# Patient Record
Sex: Male | Born: 1945 | Race: Black or African American | Hispanic: No
Health system: Southern US, Community
[De-identification: ages and names within clinical notes are randomized; demographics above are authoritative.]

---

## 2007-05-09 ENCOUNTER — Other Ambulatory Visit: Payer: Self-pay

## 2007-05-09 ENCOUNTER — Emergency Department: Payer: Self-pay | Admitting: Emergency Medicine

## 2007-12-29 ENCOUNTER — Other Ambulatory Visit: Payer: Self-pay

## 2007-12-30 ENCOUNTER — Inpatient Hospital Stay: Payer: Self-pay | Admitting: Internal Medicine

## 2007-12-30 ENCOUNTER — Other Ambulatory Visit: Payer: Self-pay

## 2007-12-31 ENCOUNTER — Other Ambulatory Visit: Payer: Self-pay

## 2009-06-10 ENCOUNTER — Inpatient Hospital Stay: Payer: Self-pay | Admitting: Internal Medicine

## 2009-07-17 ENCOUNTER — Emergency Department: Payer: Self-pay | Admitting: Emergency Medicine

## 2009-07-19 ENCOUNTER — Ambulatory Visit: Payer: Self-pay | Admitting: Otolaryngology

## 2009-08-02 ENCOUNTER — Ambulatory Visit: Payer: Self-pay | Admitting: Otolaryngology

## 2010-04-09 ENCOUNTER — Inpatient Hospital Stay: Payer: Self-pay | Admitting: Internal Medicine

## 2010-06-10 ENCOUNTER — Emergency Department: Payer: Self-pay | Admitting: Internal Medicine

## 2010-09-05 ENCOUNTER — Inpatient Hospital Stay: Payer: Self-pay | Admitting: Internal Medicine

## 2010-11-21 ENCOUNTER — Emergency Department: Payer: Self-pay | Admitting: Internal Medicine

## 2011-04-02 ENCOUNTER — Emergency Department: Payer: Self-pay | Admitting: Emergency Medicine

## 2012-02-05 IMAGING — CT CT HEAD WITHOUT CONTRAST
2 series · 15 of 30 positions shown, 19 images · non-contrast
Comparison: none

REASON FOR EXAM: CVA
COMMENTS:   May transport without cardiac monitor

PROCEDURE:     CT  - CT HEAD WITHOUT CONTRAST  - April 02, 2011  [DATE]
RESULT:     Comparison:  09/06/2010
TECHNIQUE: Multiple axial images from the foramen magnum to the vertex were
obtained without IV contrast.

[Series 2: without · axial · non-contrast · 0.41mm/px · z∈[+1234,+1354]mm · 13 of 28 slices shown, 17 images]
[im 2/28  brain]
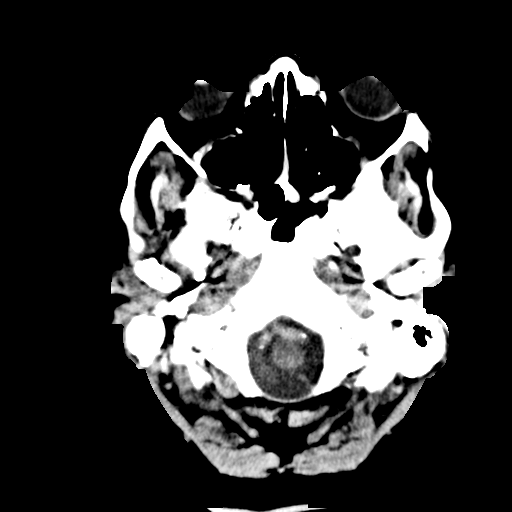
[im 2/28  bone]
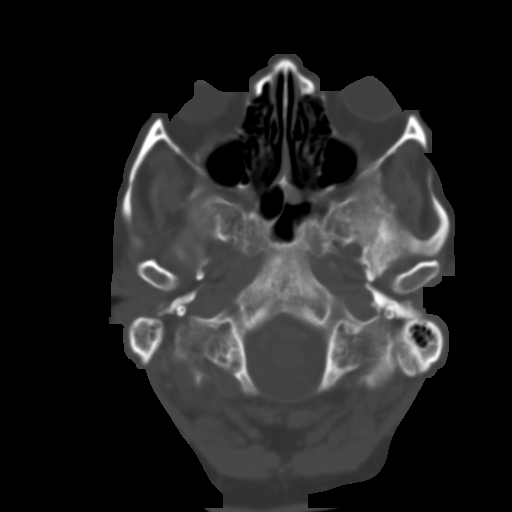
[im 4/28  brain]
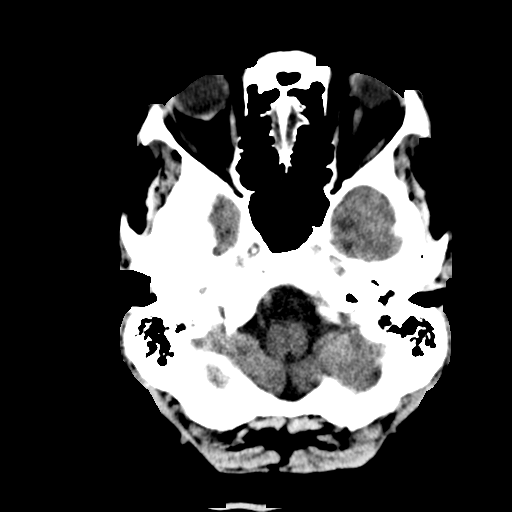
[im 6/28  brain]
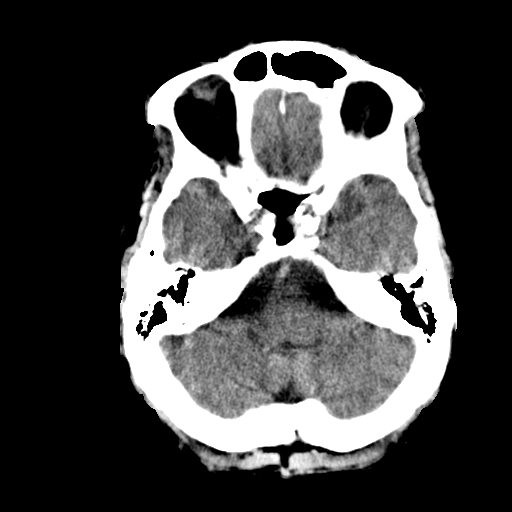
[im 8/28  brain]
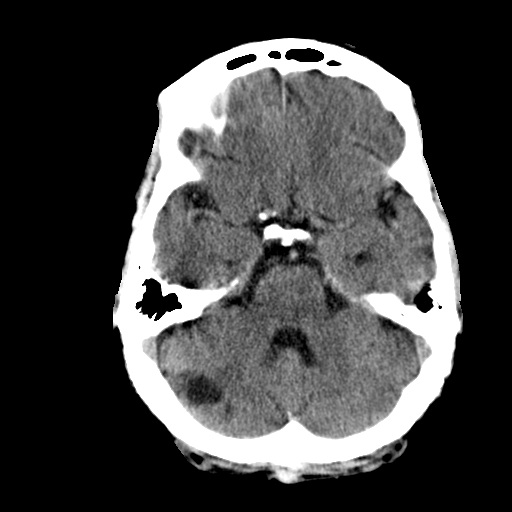
[im 10/28  brain]
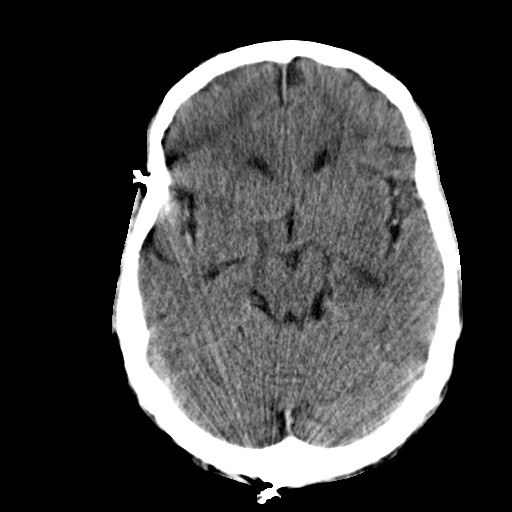
[im 10/28  bone]
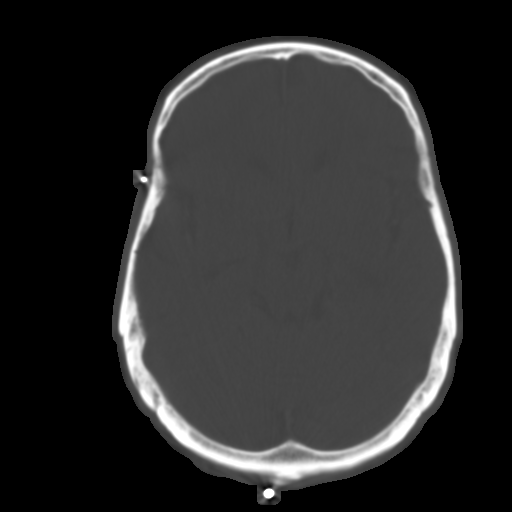
[im 12/28  brain]
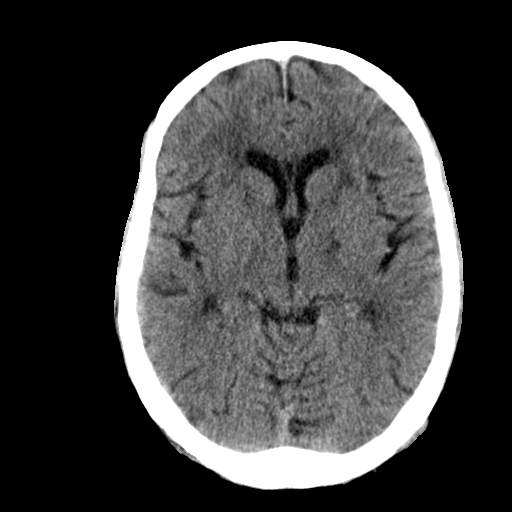
[im 14/28  brain]
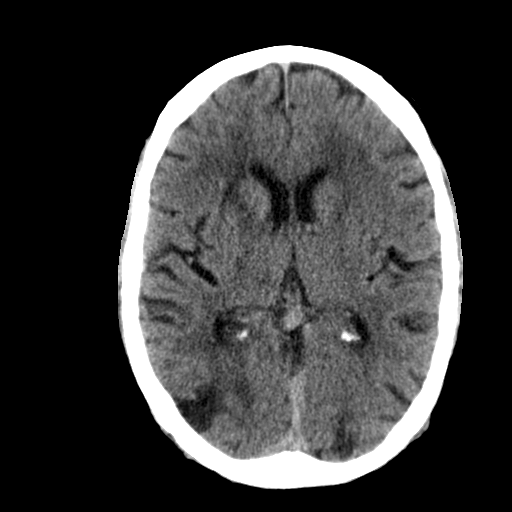
[im 16/28  brain]
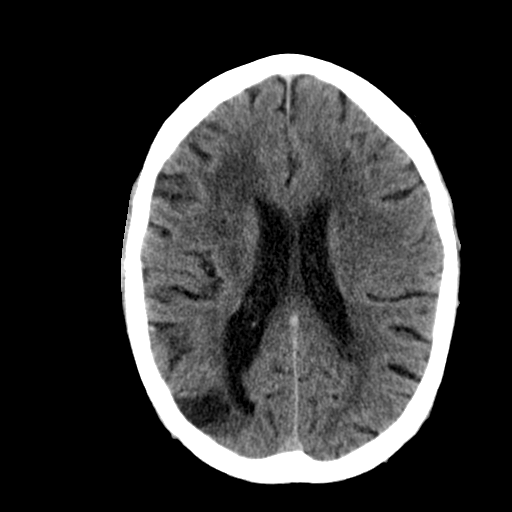
[im 18/28  brain]
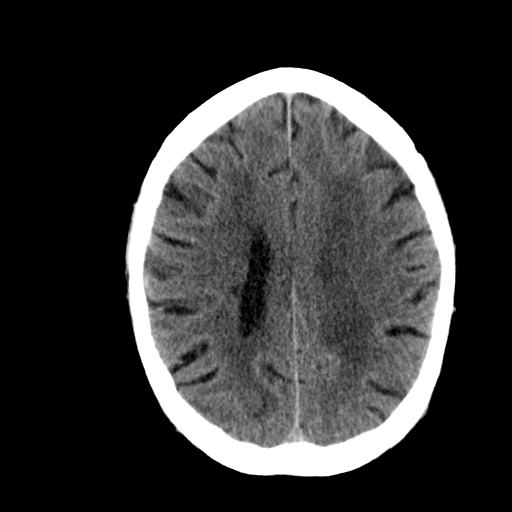
[im 18/28  bone]
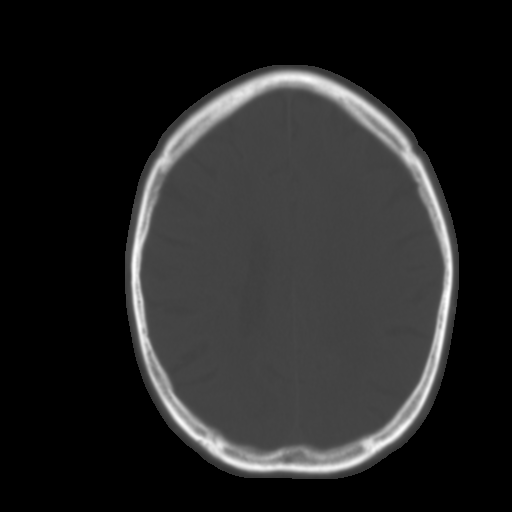
[im 20/28  brain]
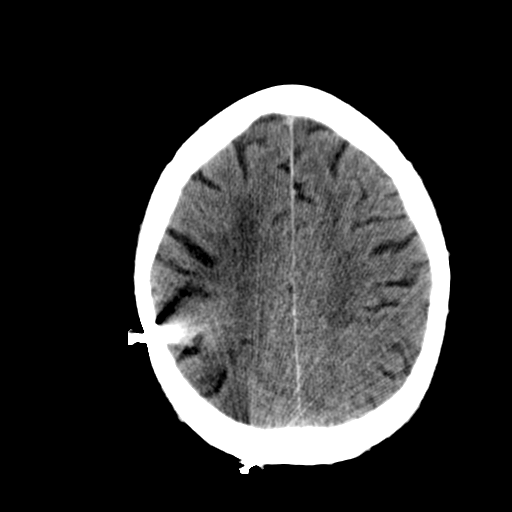
[im 22/28  brain]
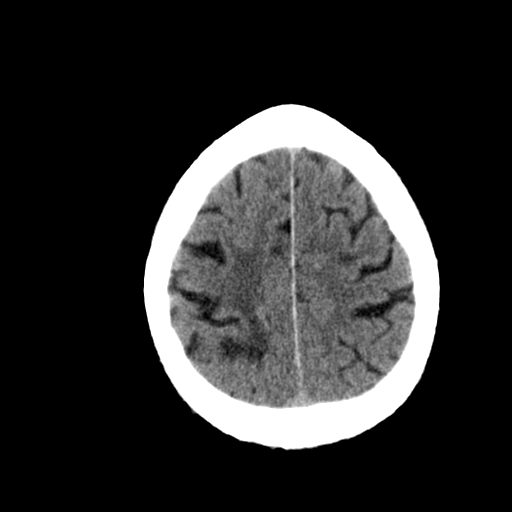
[im 24/28  brain]
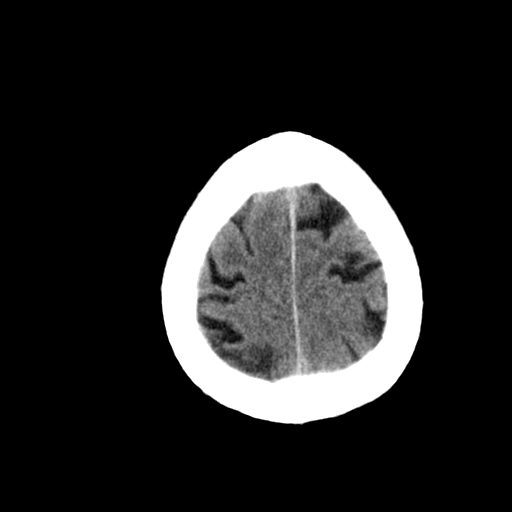
[im 26/28  brain]
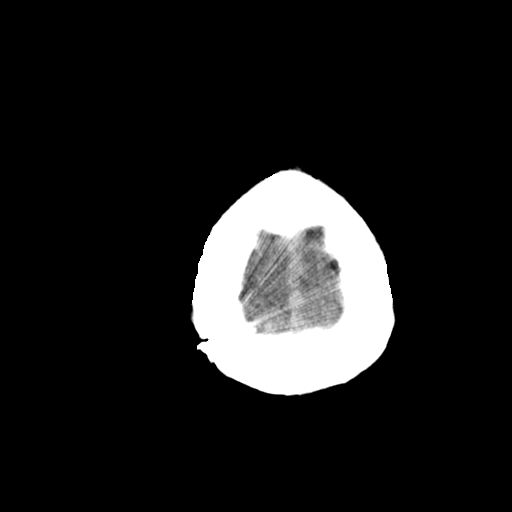
[im 26/28  bone]
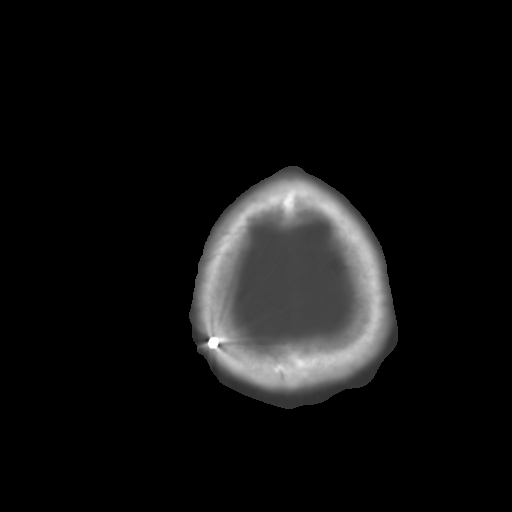

[Series 3: bone · axial · 0.41mm/px · z∈[+1234,+1254]mm · 2 of 28 slices shown]
[im 2/28  bone]
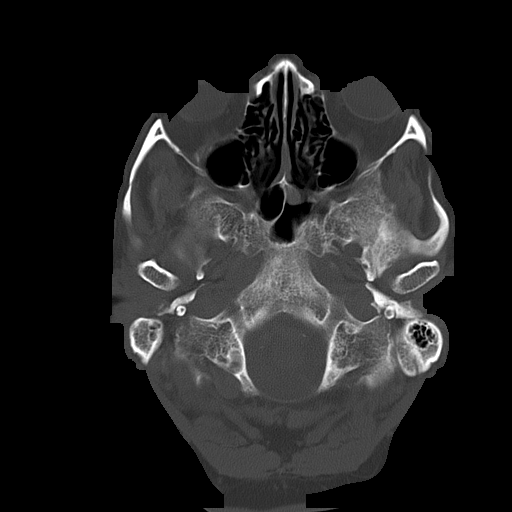
[im 6/28  bone]
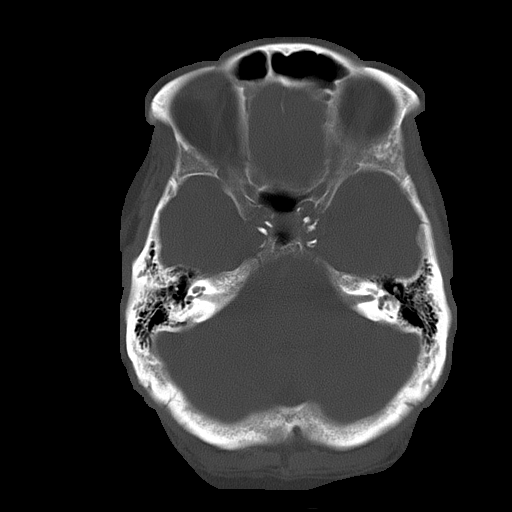

[15 of 30 positions shown; findings below may reference images not displayed]

FINDINGS: There is no evidence for mass effect, midline shift, or extra-axial fluid
collections. There is no evidence for space-occupying lesion, intracranial
hemorrhage, or acute cortical-based area of infarction. Periventricular
hypoattenuation is consistent with chronic small vessel ischemic disease.
Encephalomalacia is seen from old right parietal lobe infarcts and old right
cerebellar infarct. Streak artifact from round metallic densities in the
scalp slight limits evaluation. Old lacunar infarct demonstrated in the left
internal capsule.

The osseous structures are unremarkable.
IMPRESSION: 1. No acute intracranial process.
2. Chronic small vessel ischemic disease. Multiple old right-sided infarcts

CT can underestimate ischemia in the first 24 hours after the event. If
there is clinical concern for an acute infarct, a followup MRI or repeat CT
scan in 24 hours may provide additional information..

## 2013-01-02 ENCOUNTER — Inpatient Hospital Stay: Payer: Self-pay | Admitting: Specialist

## 2013-01-02 LAB — COMPREHENSIVE METABOLIC PANEL
Anion Gap: 7 (ref 7–16)
BUN: 21 mg/dL — ABNORMAL HIGH (ref 7–18)
Co2: 26 mmol/L (ref 21–32)
EGFR (African American): >60
EGFR (Non-African Amer.): 57 — ABNORMAL LOW
Glucose: 129 mg/dL — ABNORMAL HIGH (ref 65–99)
Potassium: 4.6 mmol/L (ref 3.5–5.1)
SGOT(AST): 43 U/L — ABNORMAL HIGH (ref 15–37)
SGPT (ALT): 53 U/L (ref 12–78)
Total Protein: 7.7 g/dL (ref 6.4–8.2)

## 2013-01-02 LAB — CBC
HGB: 14.8 g/dL (ref 13.0–18.0)
MCH: 30 pg (ref 26.0–34.0)
MCHC: 32.8 g/dL (ref 32.0–36.0)
MCV: 92 fL (ref 80–100)
RBC: 4.94 10*6/uL (ref 4.40–5.90)
WBC: 8.9 10*3/uL (ref 3.8–10.6)

## 2013-01-02 LAB — TROPONIN I: Troponin-I: 0.04 ng/mL

## 2013-01-02 LAB — PRO B NATRIURETIC PEPTIDE: B-Type Natriuretic Peptide: 5967 pg/mL — ABNORMAL HIGH (ref 0–125)

## 2013-01-03 LAB — CBC WITH DIFFERENTIAL/PLATELET
Basophil #: 0 10*3/uL (ref 0.0–0.1)
Basophil %: 0.1 %
Eosinophil #: 0 10*3/uL (ref 0.0–0.7)
Eosinophil %: 0 %
HGB: 14.4 g/dL (ref 13.0–18.0)
Lymphocyte #: 0.8 10*3/uL — ABNORMAL LOW (ref 1.0–3.6)
MCH: 29.6 pg (ref 26.0–34.0)
MCHC: 32.7 g/dL (ref 32.0–36.0)
MCV: 90 fL (ref 80–100)
Monocyte #: 0.3 x10 3/mm (ref 0.2–1.0)
Monocyte %: 2.1 %
Neutrophil #: 11.3 10*3/uL — ABNORMAL HIGH (ref 1.4–6.5)
Platelet: 218 10*3/uL (ref 150–440)
RBC: 4.88 10*6/uL (ref 4.40–5.90)
RDW: 12.9 % (ref 11.5–14.5)
WBC: 12.4 10*3/uL — ABNORMAL HIGH (ref 3.8–10.6)

## 2013-01-03 LAB — BASIC METABOLIC PANEL
Anion Gap: 10 (ref 7–16)
BUN: 23 mg/dL — ABNORMAL HIGH (ref 7–18)
Calcium, Total: 8.7 mg/dL (ref 8.5–10.1)
Chloride: 108 mmol/L — ABNORMAL HIGH (ref 98–107)
Creatinine: 1.17 mg/dL (ref 0.60–1.30)
EGFR (African American): >60
EGFR (Non-African Amer.): >60
Glucose: 154 mg/dL — ABNORMAL HIGH (ref 65–99)
Osmolality: 290 (ref 275–301)
Sodium: 142 mmol/L (ref 136–145)

## 2013-01-07 LAB — CULTURE, BLOOD (SINGLE)

## 2013-01-19 ENCOUNTER — Observation Stay: Payer: Self-pay | Admitting: Internal Medicine

## 2013-01-19 LAB — COMPREHENSIVE METABOLIC PANEL
Alkaline Phosphatase: 83 U/L (ref 50–136)
Bilirubin,Total: 2.3 mg/dL — ABNORMAL HIGH (ref 0.2–1.0)
Chloride: 108 mmol/L — ABNORMAL HIGH (ref 98–107)
Co2: 30 mmol/L (ref 21–32)
Creatinine: 1.2 mg/dL (ref 0.60–1.30)
EGFR (African American): >60
SGOT(AST): 18 U/L (ref 15–37)
SGPT (ALT): 30 U/L (ref 12–78)
Sodium: 142 mmol/L (ref 136–145)

## 2013-01-19 LAB — CK TOTAL AND CKMB (NOT AT ARMC)
CK, Total: 70 U/L (ref 35–232)
CK-MB: 2.6 ng/mL (ref 0.5–3.6)

## 2013-01-19 LAB — CBC
HCT: 47.8 % (ref 40.0–52.0)
MCH: 29.9 pg (ref 26.0–34.0)
MCHC: 32.4 g/dL (ref 32.0–36.0)
RBC: 5.18 10*6/uL (ref 4.40–5.90)
RDW: 13.7 % (ref 11.5–14.5)

## 2013-01-19 LAB — URINALYSIS, COMPLETE
Blood: NEGATIVE
Hyaline Cast: 16
Ketone: NEGATIVE
Leukocyte Esterase: NEGATIVE
Nitrite: NEGATIVE
RBC,UR: <1 /HPF (ref 0–5)
Squamous Epithelial: NONE SEEN
WBC UR: <1 /HPF (ref 0–5)

## 2013-01-19 LAB — TROPONIN I
Troponin-I: 0.05 ng/mL
Troponin-I: 0.06 ng/mL — ABNORMAL HIGH

## 2013-01-19 LAB — PRO B NATRIURETIC PEPTIDE: B-Type Natriuretic Peptide: 3919 pg/mL — ABNORMAL HIGH (ref 0–125)

## 2013-02-24 ENCOUNTER — Ambulatory Visit: Payer: Self-pay | Admitting: Cardiovascular Disease

## 2013-06-08 LAB — CBC
MCV: 92 fL (ref 80–100)
Platelet: 195 10*3/uL (ref 150–440)
RBC: 5.17 10*6/uL (ref 4.40–5.90)
RDW: 13.5 % (ref 11.5–14.5)
WBC: 9.8 10*3/uL (ref 3.8–10.6)

## 2013-06-08 LAB — COMPREHENSIVE METABOLIC PANEL
Albumin: 3.2 g/dL — ABNORMAL LOW (ref 3.4–5.0)
BUN: 17 mg/dL (ref 7–18)
Calcium, Total: 9.1 mg/dL (ref 8.5–10.1)
Chloride: 110 mmol/L — ABNORMAL HIGH (ref 98–107)
Co2: 27 mmol/L (ref 21–32)
Creatinine: 1.15 mg/dL (ref 0.60–1.30)
EGFR (African American): >60
Glucose: 159 mg/dL — ABNORMAL HIGH (ref 65–99)
Osmolality: 288 (ref 275–301)
Potassium: 4 mmol/L (ref 3.5–5.1)
Total Protein: 7.3 g/dL (ref 6.4–8.2)

## 2013-06-08 LAB — URINALYSIS, COMPLETE
Bacteria: NONE SEEN
Blood: NEGATIVE
Glucose,UR: NEGATIVE mg/dL (ref 0–75)
Ketone: NEGATIVE
Leukocyte Esterase: NEGATIVE
Ph: 5 (ref 4.5–8.0)
Protein: NEGATIVE
Specific Gravity: 1.019 (ref 1.003–1.030)
Squamous Epithelial: NONE SEEN
WBC UR: <1 /HPF (ref 0–5)

## 2013-06-09 ENCOUNTER — Observation Stay: Payer: Self-pay | Admitting: Internal Medicine

## 2013-06-09 LAB — AMMONIA: Ammonia, Plasma: 33 mcmol/L — ABNORMAL HIGH (ref 11–32)

## 2013-06-09 LAB — PRO B NATRIURETIC PEPTIDE: B-Type Natriuretic Peptide: 1192 pg/mL — ABNORMAL HIGH (ref 0–125)

## 2013-06-10 LAB — CBC WITH DIFFERENTIAL/PLATELET
Basophil #: 0.1 10*3/uL (ref 0.0–0.1)
Basophil %: 0.6 %
Eosinophil #: 0.5 10*3/uL (ref 0.0–0.7)
Eosinophil %: 4.3 %
HCT: 46 % (ref 40.0–52.0)
HGB: 15.6 g/dL (ref 13.0–18.0)
Lymphocyte %: 30.5 %
MCH: 31.1 pg (ref 26.0–34.0)
Monocyte #: 0.9 x10 3/mm (ref 0.2–1.0)
Monocyte %: 8.2 %
RBC: 5.03 10*6/uL (ref 4.40–5.90)
RDW: 13.2 % (ref 11.5–14.5)
WBC: 10.6 10*3/uL (ref 3.8–10.6)

## 2013-06-10 LAB — BASIC METABOLIC PANEL
BUN: 18 mg/dL (ref 7–18)
Creatinine: 0.98 mg/dL (ref 0.60–1.30)
EGFR (Non-African Amer.): >60
Glucose: 172 mg/dL — ABNORMAL HIGH (ref 65–99)
Sodium: 138 mmol/L (ref 136–145)

## 2013-09-17 DEATH — deceased

## 2014-04-14 IMAGING — CR DG CHEST 2V
1 series · 2 of 2 positions shown · non-contrast
Comparison: none

REASON FOR EXAM: dizziness eval pna
COMMENTS:

[Series 1: w chest pa · 0.14mm/px · 2 of 2 slices shown]
[im 1/2]
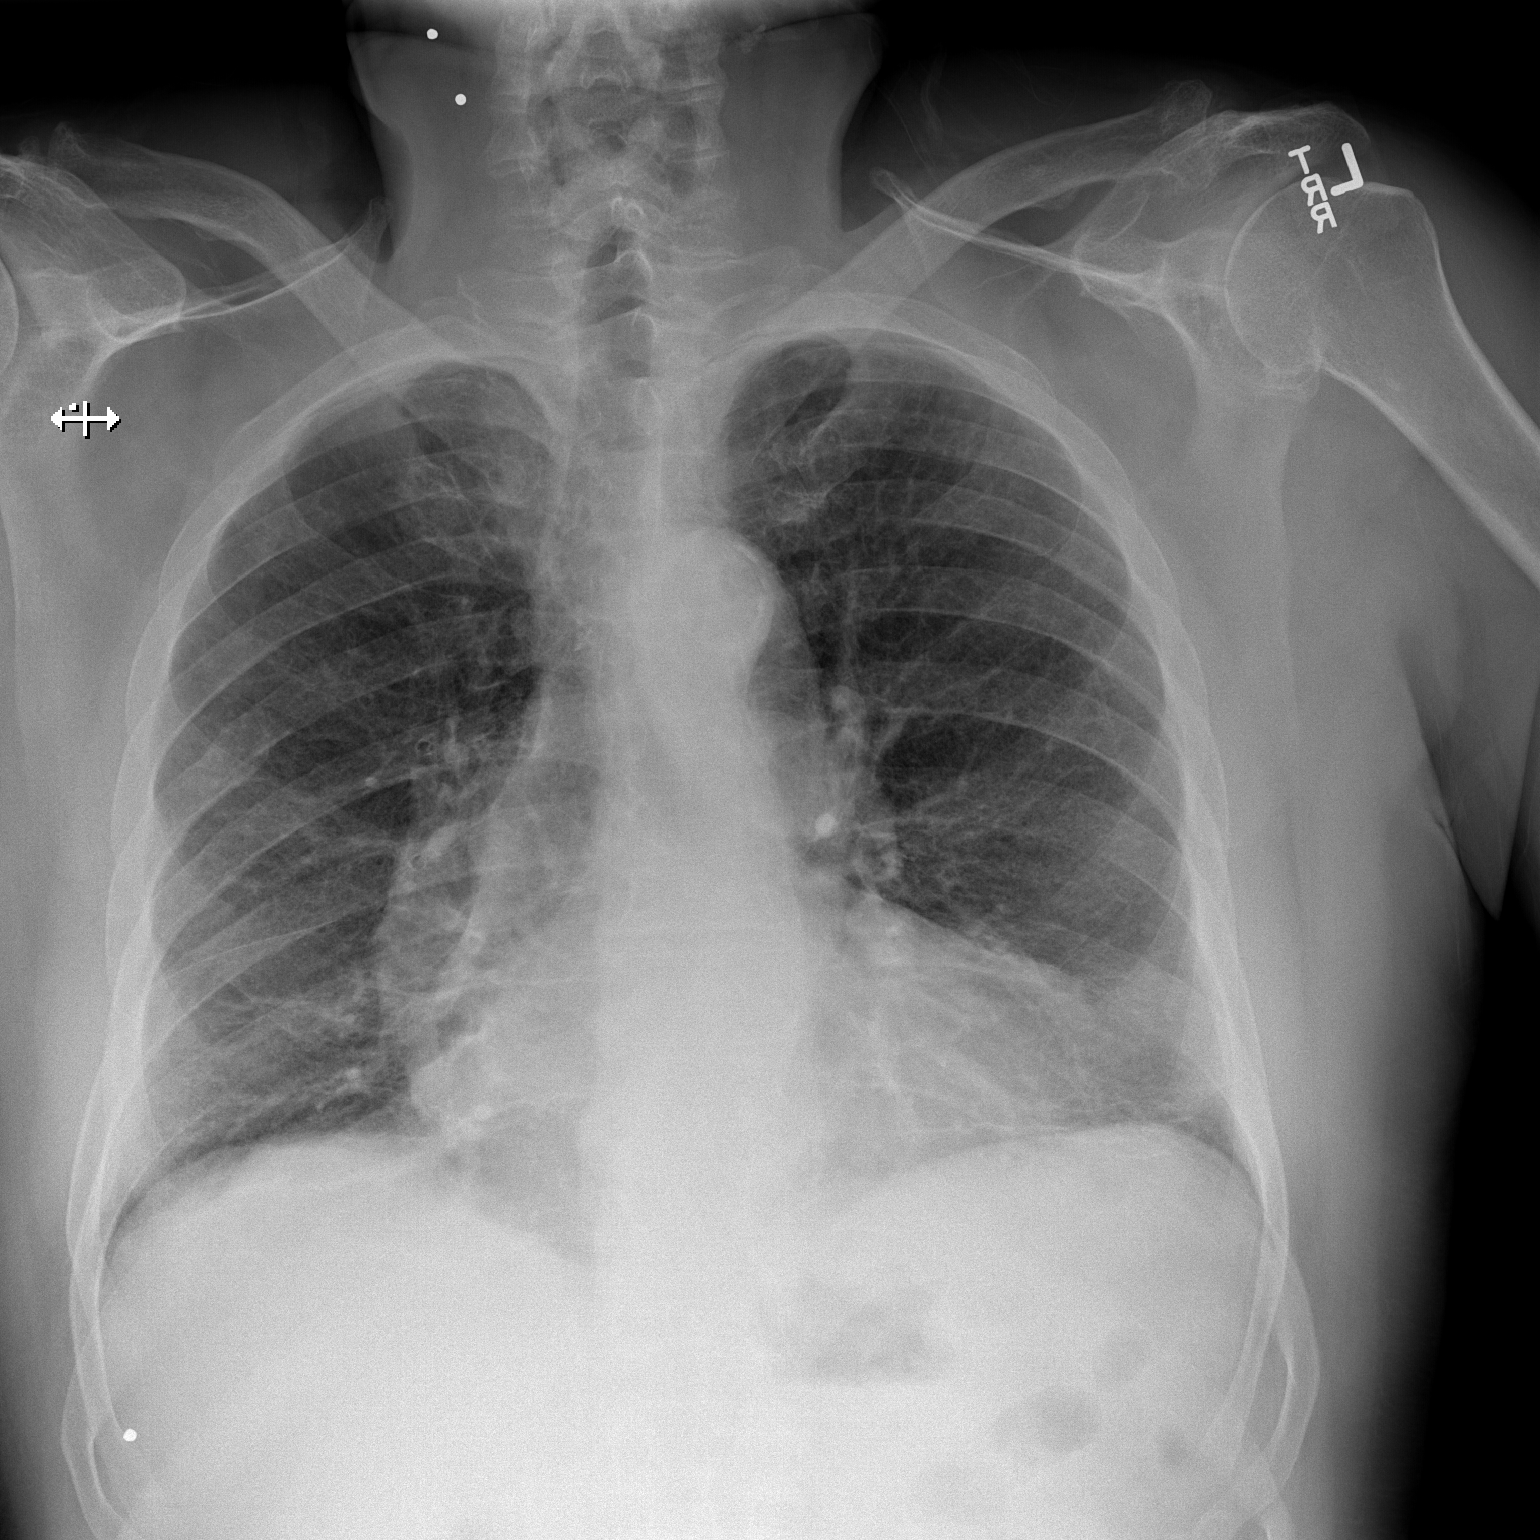
[im 2/2]
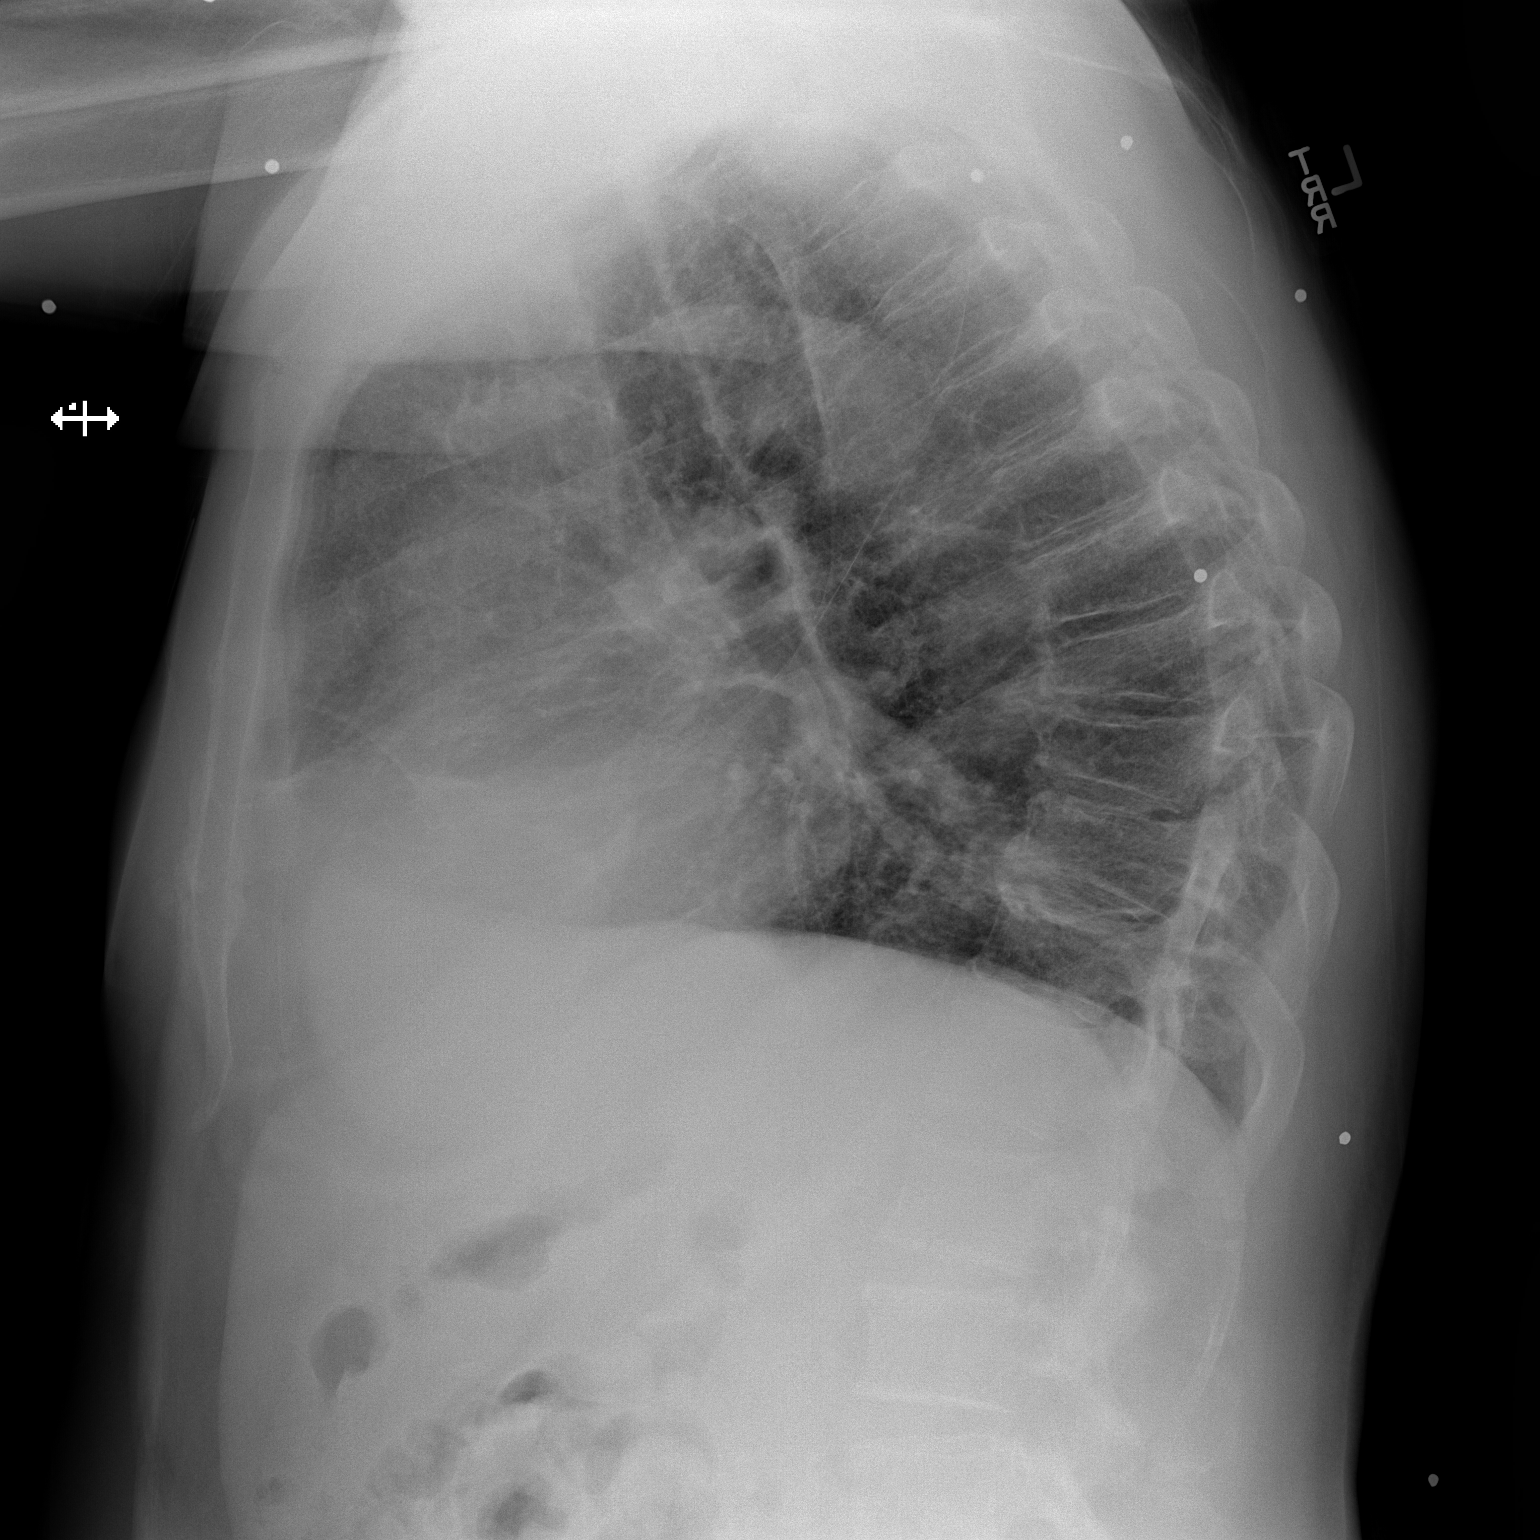

[2 of 2 positions shown; findings below may reference images not displayed]

PROCEDURE:     DXR - DXR CHEST PA (OR AP) AND LATERAL  - June 09, 2013  [DATE]

RESULT:     Comparison is made to a study January 17, 2013.

The lungs are adequately inflated. The interstitial markings are increased
today above that seen previously. The cardiac silhouette is enlarged. The
central pulmonary vascularity is mildly prominent. There is tortuosity of
the descending thoracic aorta. There are coarse lung markings in the
retrocardiac region on the lateral film inferiorly but a portion of this is
related to degenerative change of the lower thoracic spine.
IMPRESSION: The findings may reflect low-grade interstitial edema. This
is superimposed upon chronic changes of low-grade CHF. There is no focal
pneumonia. Followup films following therapy are recommended.

[REDACTED]

## 2015-03-09 NOTE — H&P (Signed)
PATIENT NAME:  Marcus [name], Marcus [name] MR#:  [id number] DATE OF BIRTH:  1946/10/13  DATE OF ADMISSION:  06/09/2013  PRIMARY CARE PROVIDER: Nicky Pugh, MD  ED REFERRING PHYSICIAN:  Charlesetta Ivory, MD  CHIEF COMPLAINT: Dizziness, some confusion.   HISTORY OF PRESENT ILLNESS: The patient is a 69 year old African American male with significant medical history of previous CVA, hypertension, coronary artery disease, severe cardiomyopathy with EF of 25% to 30% with chronic respiratory failure who is supposed to be on 2 liters of nasal cannula at all times and COPD who is not wearing his oxygen, presents with complaint of feeling lightheaded for a prolonged period of time, however, his lightheadedness is worse over the past few days. He says that he is lightheaded at rest and with activity. Positioning does not change his lightheadedness. The patient had orthostatics done in the ED, and they were negative. The patient also has had some confusion in terms of short-term memory. His niece who works in the ED here reports that he was more confused yesterday. He did not know who he was.  The patient does not recall his birthdate today, but he knows he is at the hospital. He has had some issues with forgetting short term things. He otherwise denies any chest pain, shortness of breath. No palpitations. No syncope. No abdominal pain.  No nausea, vomiting, diarrhea. No urinary symptoms.   PAST MEDICAL HISTORY:  1.  CVA.  2.  Hypertension.  3.  Coronary artery disease with history of stents.  4.  COPD, supposed to be on 2 liters oxygen, which he is not wearing.  5.  Severe cardiomyopathy with EF of 25% to 30%.   ALLERGIES: No known drug allergies.   CURRENT MEDICATIONS: At home:  1.  Advair 250/50 INH b.i.d. 2.  Carvedilol 25 mg 1 tab p.o. b.i.d. 3.  Lasix 20 mg daily. 4.  Lisinopril 20 mg daily. 5.  Spiriva inhalation daily.  6.  Spironolactone 25 mg p.o. daily.   SOCIAL HISTORY: Continues to smoke 1 pack per  day. He does not drink. No drugs.   FAMILY HISTORY: Significant for hypertension and CVA in the family.  REVIEW OF SYSTEMS:  CONSTITUTIONAL: Denies any fevers, chills. Complains of some weakness and fatigue. No weight gain. No weight loss.  EYES: Denies any blurred vision or double vision. No pain. No inflammation. No glaucoma.  EARS:  Denies any tinnitus, ear pain, hearing loss epistaxis or discharge.   OROPHARYNX:  Denies any difficulty swallowing.  RESPIRATORY: Denies any cough, hemoptysis, wheezing.  CARDIOVASCULAR: Denies any chest pains, palpitations or syncope. Complains of some dizziness.  GASTROINTESTINAL: Denies any nausea, vomiting, abdominal pain, hematemesis, rectal bleed or melena.  GENITOURINARY: Denies any dysuria, hematuria, renal colic.  ENDOCRINE: Denies any polyuria, polydipsia, heat or cold intolerance.  HEMATOLOGIC: Denies any anemia, easy bruisability or bleeding.  SKIN: Denies any acne, rash. No changes in skin lesions.  MUSCULOSKELETAL: Denies any neck, back or shoulder pain. No gout. The patient relates chronic right leg pain. He reports that he had fractured it about 15 to 20 years ago.  NEUROLOGICAL: Denies any dysarthria, epilepsy, tremor, vertigo, seizures. He does have memory loss, in terms of short-term memory. The patient does have a history of CVA with left-sided weakness, in the upper extremity mainly.  PSYCHIATRIC: Denies any anxiety, insomnia, bipolar disorder, depression  PHYSICAL EXAMINATION: VITAL SIGNS: Temperature 99.2, pulse 64, respirations 16, blood pressure 127/83, O2 93%.  GENERAL: The patient is a well-developed, well-nourished African American  male, currently not in any acute distress.  HEENT: Head atraumatic, normocephalic. Pupils equally round and reactive to light and accommodation. There is no conjunctival pallor. No scleral icterus.  NECK: Supple. No thyromegaly. No JVD chess.  CARDIOVASCULAR: Regular rate and rhythm. No murmurs, rubs,  clicks or gallops. PMI is not displaced.  LUNGS: No accessory muscle usage. There are some crackles at the bases. There are no rales, rhonchi, wheezing.  ABDOMEN: Soft, nontender, nondistended. Positive bowel sounds x 4. There is no hepatosplenomegaly.  EXTREMITIES: No clubbing, cyanosis or edema.  SKIN: No rash.  LYMPHATICS: No lymph nodes palpable.  PSYCHIATRIC: The patient is awake, alert and oriented to place, person. He is a little confused about his date of birth. NEUROLOGICAL: Cranial nerves II through XII grossly intact. Some left upper extremity weakness.  LABORATORY AND DIAGNOSTICS: Glucose 159. BNP 1192. BUN 17, creatinine 1.15, sodium 142, potassium 4.0, chloride 110, CO2 27, calcium 9.1. LFTs: Total protein 7.3, albumin 3.2, bili total 1.1, alk phos 83, AST 19, ALT 32. WBC 9.8, hemoglobin 16, platelet count 195. Urinalysis is negative.  CT scan of the head without contrast shows no acute intracranial processes. Chest x-ray shows findings that may reflect low-grade interstitial edema.   ASSESSMENT AND PLAN: The patient is a 69 year old African American male with history of cerebrovascular accident, chronic obstructive pulmonary disease, coronary artery disease and congestive heart failure who presents with worsening dizziness, some confusion. 1.  Dizziness. Appears to be exacerbation of his chronic dizziness. It does not sound like vertigo.  However, we will try meclizine due to persistent nature. We will try to ambulate the patient.  2.  Confusion. Seems to have baseline dementia which is probably mild. There was some concern about worsening. At this time, we will just check a TSH and B12 level. The patient will need outpatient neurology evaluation.  3.  History of chronic obstructive pulmonary disease with recommendation to wear oxygen at all times.  Recommend the patient be compliant with this.  4.  Chronic systolic congestive heart failure with chest x-ray suggestive of congestive  heart failure. The patient clinically appears to be asymptomatic from this.  These chest x-ray findings are likely chronic in nature. He is on p.o. Lasix and spironolactone. I am going to go ahead and increase his Lasix dose to 40 b.i.d. for the time being.  5.  Nicotine addiction. The patient counseled regarding smoking cessation for 4 minutes, and a nicotine patch will be started.  TIME SPENT: 45 minutes on this H and P.   ____________________________ Chana Bode H. Posey Pronto, MD shp:sb D: 06/09/2013 08:37:00 ET T: 06/09/2013 09:15:13 ET JOB#: [id number]  cc: Vinaya Sancho H. Posey Pronto, MD, <Dictator> Alric Seton MD ELECTRONICALLY SIGNED 06/20/2013 9:00

## 2015-03-09 NOTE — H&P (Signed)
PATIENT NAME:  Marcus [name], Marcus [name] DATE OF BIRTH:  August 19, 1946  DATE OF ADMISSION:  01/02/2013  PRIMARY CARE PHYSICIAN:    REFERRING PHYSICIAN: Chiquita LothJade Sung, MD.   CHIEF COMPLAINT: Shortness of breath.   HISTORY OF PRESENT ILLNESS: This is [name] 69 year old male with known past medical history of hypertension, CVA, coronary artery disease, tobacco abuse, who presents with complaint of shortness of breath. The patient reports he has been feeling short of breath for the last 2 days. As well, he had some cough with minimal productive sputum over the last day. Denies any fever or chills. In the ED, the patient was noticed to be saturating 91% on room air when he was started on oxygen. He had chest x-ray done, which did show right lower lung infiltrate. As well, the patient has diffuse wheezing too. The patient had elevated proBNP, known to have history of congestive heart failure, diastolic/systolic, with ejection fraction of 30% to 35% and an echo done in 2011 with mild ventricular hypertrophy and dilatation. The patient was given 20 of IV Lasix, which gave him improvement of his symptoms. He received nebulizer and Solu-Medrol as well, which improved his wheezing. The patient denies any chest pain or worsening lower extremity edema. Hospitalist service were requested to admit the patient for further management and treatment of his acute respiratory failure.   PAST MEDICAL HISTORY:  1.  History of CVA in the past.  2.  History of hypertension.  3.  History of coronary artery disease.   DRUG ALLERGIES: No known drug allergies.   HOME MEDICATIONS:  1.  Aggrenox 1 tablet 2 times [name] day.  2.  Coreg 25 mg oral 2 times [name] day.    SOCIAL HISTORY: The patient lives at home. Currently smokes 1 pack per day. He has remote history of cocaine and marijuana use, but nothing over the last few years. No history of alcohol. He used to work in [employer].   FAMILY HISTORY: Significant for hypertension and CVA  in the family. No early coronary artery disease.   REVIEW OF SYSTEMS:  CONSTITUTIONAL: Denies any fever or chills. Complains of fatigue and weakness.  EYES: Denies blurry vision, double vision, pain, inflammation or glaucoma.  ENT: Denies tinnitus, ear pain, hearing loss, epistaxis or discharge.  RESPIRATORY: Complains of cough, wheezing and shortness of breath. Denies hemoptysis.  CARDIOVASCULAR: Denies chest pain, edema, arrhythmia, palpitations or syncope.  GASTROINTESTINAL: Denies nausea, vomiting, diarrhea, abdominal pain, hematemesis, rectal bleed, coffee-ground emesis, hematochezia or melena.  GENITOURINARY: Denies dysuria, hematuria or renal colic.  ENDOCRINE: Denies polyuria, polydipsia, heat or cold intolerance.  HEMATOLOGY: Denies anemia, easy bruising or bleeding diathesis.  INTEGUMENTARY: Denies acne, rash or skin lesions.  MUSCULOSKELETAL: Denies neck pain, shoulder pain, arthritis, cramps or gout. NEUROLOGIC: Denies dysarthria, epilepsy, tremors, vertigo, seizures or memory loss. Has history of CVA with very minimal residual left-sided weakness.  PSYCHIATRIC: Denies any anxiety, insomnia, bipolar disorder or depression. Has remote history of substance abuse.   PHYSICAL EXAMINATION:  VITAL SIGNS: Temperature 97.6, pulse 92, respiratory rate 24, blood pressure 142/93, saturating 98% on oxygen.  GENERAL: An elderly male, who looks older than his stated age. He is comfortable and in no apparent distress.  HEENT: Head is atraumatic, normocephalic. Pupils are equal and reactive to light. Pink conjunctivae. Anicteric sclerae. Moist oral mucosa.  NECK: Supple. No thyromegaly. No JVD.  CHEST: Good air entry bilaterally with bibasilar crackles and diffuse wheezing.  CARDIOVASCULAR: S1, S2 heard. No rubs,  murmurs or gallops.  ABDOMEN: Soft, nontender, nondistended. Bowel sounds present.  EXTREMITIES: No edema. No clubbing. No cyanosis.  PSYCHIATRIC: Appropriate affect. Awake and alert x  3. Intact judgment and insight.   NEUROLOGIC: Cranial nerves grossly intact. Motor 5 out of 5. SKIN: Normal skin turgor. Warm and dry. No rash.   PERTINENT LABORATORIES: Glucose 129,  proBNP 5167, BUN 21, creatinine 1.32, sodium 144, potassium 4.6, chloride 111, ALT 53, AST 43, alkaline phosphatase 105. Troponin 0.04. White blood cells 11.9, hemoglobin 14.8, hematocrit 45.2, platelets 201. EKG showing normal sinus rhythm with ventricular rate of 63 with some Q waves in aVF and lead III, but this is old when compared to last EKG in 2011.   ASSESSMENT AND PLAN: 1.  Acute respiratory failure. The patient presents with shortness of breath. This appears to be multifactorial, most likely due to chronic obstructive pulmonary disease exacerbation, pneumonia and acute congestive heart failure. We will continue with oxygen.  2.  Congestive heart failure. The patient's last echo showing diastolic and systolic dysfunction, so appears to be having acute diastolic and systolic congestive heart failure. We will repeat echocardiogram. His last ejection fraction was 30 to 35%. We will continue with diuresis. We will have him strict ins and outs and daily weights. The patient is already on beta blockers. If his ejection fraction is less than 30 to 35%, he will be started on ACE inhibitor as well.  3.  Pneumonia. The patient has right lower lung infiltrate. We will continue with Levaquin. We will follow on blood cultures.  4.  Chronic obstructive pulmonary disease exacerbation. The patient was never diagnosed with chronic obstructive pulmonary disease in the past, but appears to be having significant wheezing. He will be started on Solu-Medrol and nebulizer treatment as well.  5.  History of cerebrovascular accident. We will continue with Aggrenox. We will check lipid panel. If elevated, he will be started on statin. 6.  Coronary artery disease. Continue with Aggrenox and beta blocker. We will check lipid panel. If  elevated,  will have him on statin.  7.  Hypertension, acceptable. We will continue with Coreg.  8.  Deep vein thrombosis prophylaxis. Subcutaneous heparin.  9.  Gastrointestinal prophylaxis, on Protonix.  10. Tobacco abuse. The patient was counseled at length and expresses his wishes to quit smoking. Will be started on Nicoderm patch.  11. CODE STATUS: Full code.   TOTAL TIME SPENT ON ADMISSION AND PATIENT CARE: 60 minutes.   ____________________________ Starleen Arms, MD dse:aw D: 01/02/2013 08:50:08 ET T: 01/02/2013 10:01:15 ET JOB#: [id number]  cc: Starleen Arms, MD, <Dictator> Sharissa Brierley Teena Irani MD ELECTRONICALLY SIGNED 01/03/2013 3:48

## 2015-03-09 NOTE — Consult Note (Signed)
PATIENT NAME:  Marcus [name], Marcus [name] DATE OF BIRTH:  1946-08-24  DATE OF CONSULTATION:  01/19/2013  REFERRING PHYSICIAN:  Huey Bienenstockawood Elgergawy, MD CONSULTING PHYSICIAN:  Marcus EllenMonica A. Manzi, PA-C  PRIMARY CARE PHYSICIAN: Marcus CookeyErnest Eason, MD  REASON FOR CONSULTATION: New EKG changes with T wave inversion in V5 and V6.   HISTORY OF PRESENT ILLNESS: Mr. Marcus [name] is a pleasant 69 year old male with a past medical history significant for CVA, hypertension, coronary artery disease, cardiomyopathy with ejection fraction of 25% to 30%, and COPD. He presented during this hospitalization with weakness and increased shortness of breath. He denies any coughing, chest pain, chest pressure or chest heaviness, but was found to have some new EKG changes. The patient had an elevated BNP in the Emergency Department and was also found to be hypoxic. He currently states that he is breathing fine.   PAST MEDICAL HISTORY:   1.  CVA with some residual weakness in the left arm.  2.  Hypertension.  3.  Coronary artery disease. 4.  MI with stent placement approximately 2 years ago (exact details of this is unknown).  5.  COPD.  6.  Chronic respiratory failure, on 2 liters of oxygen via nasal cannula.  7.  Congestive heart failure with cardiomyopathy with ejection fraction on last admission about 25% to 30%.   ALLERGIES:  No known drug allergies.   HOME MEDICATIONS: 1.  Aggrenox 1 tablet b.i.d.  2.  Carvedilol 25 mg p.o. b.i.d.  3.  Lisinopril 5 mg p.o. daily.  4.  Advair Diskus 250/50 inhaled b.i.d.  5.  Spiriva inhaler inhaled once daily.  6.  Lasix 40 mg p.o. daily.  7.  Levaquin 500 mg p.o. x 5 days.  8.  Potassium 10 milliequivalents extended release p.o. daily.   FAMILY HISTORY: Of coronary artery disease in his brother who died in his late 9360s. Brother also had a CVA. Also hypertension but no history of diabetes.   SOCIAL HISTORY: The patient states he smokes 1 pack per day and has been smoking  "all of his life."  Denies any alcohol or caffeine use.  REVIEW OF SYSTEMS:   CONSTITUTIONAL: The patient complains of weakness.  RESPIRATORY: The patient initially came in with shortness of breath, which has improved.  CARDIOVASCULAR: The patient denies any chest pain, chest pressure or squeezing.  GASTROENTEROLOGY: The patient denies any abdominal pain.  MUSCULOSKELETAL: The patient denies any edema.   PHYSICAL EXAMINATION: GENERAL: This is a pleasant African American male who is not in any acute distress. He is alert and oriented x 3.  VITAL SIGNS: Temperature 97.5 degrees Fahrenheit, heart rate 67, respiratory rate 16, blood pressure 156/81 and O2 saturation 94% at rest at room air.  HEAD AND FACE: Head atraumatic, normocephalic.  EYES: Pupils are round and equal. There is pale conjunctivae. There is no scleral icterus.  EARS AND NOSE:  Normal to external inspection.  MOUTH: The patient has multiple missing teeth.  NECK: Supple. Trachea is midline. There are no carotid bruits. Thyroid is smooth and mobile.  LUNGS: Clear to auscultation with some diminished breath sounds.  CARDIOVASCULAR: Regular rate and rhythm. No murmurs, rubs or gallops appreciated.  ABDOMEN: Nondistended, soft, nontender to palpation.  EXTREMITIES: No cyanosis, clubbing or edema.   ANCILLARY DATA: Labs:  Glucose 154. BNP 3919. BUN 19, creatinine 1.20, sodium 142, potassium 3.8, chloride 108, CO2 30. Estimated GFR greater than 60. Total protein 7.4, albumin 3.0, total bilirubin 2.3, alkaline phosphatase 83,  AST 18, ALT 30. Total CK 7.0, CK-MB 2.6, troponin I 0.05. White blood cell count 15.5, hemoglobin 15.5, hematocrit 47.8, platelet count 183,000.   Blood gas with a pH of 7.40, pCO2 is 48.   Chest x-ray:  Mild cardiomegaly with right lung base atelectasis versus infiltrate.   EKG: Normal sinus rhythm, 79 beats per minute with PVCs, T wave inversion inferolaterally, and new T wave inversion noted in leads V5 and  V6.   ASSESSMENT AND PLAN: 1.  EKG changes with new T wave inversion in V5 and V6. The patient has a history of coronary artery disease and states he had a myocardial infarction 2 years ago with stent placement. Thus far his troponin I's have been negative x 3 and he does not have any acute symptoms. Will get echocardiogram to rule out any wall motion abnormalities and will likely need nuclear stress testing. Since the patient is stable, will wait until his respiratory function improves and can schedule outpatient stress testing. 2.  Shortness of breath, likely secondary to a combination of chronic obstructive pulmonary disease and congestive heart failure. The patient was receiving antibiotics and nebulizer treatment.  3.  Congestive heart failure with cardiomyopathy. The patient's ejection fraction is 25% to 30%. Would agree with continuing beta blocker, lisinopril, Aldactone, Aggrenox and heparin.  4.  Hypertension. Continue to monitor the patient's blood pressure.  If it remains elevated, we can make changes in his current regimen. Due to his history of cardiomyopathy, he may benefit from treatment with BiDil.     We will continue to follow this patient with you. Thank you very much for this consultation and allowing [place] to participate in this patient's care. ____________________________ Marcus Ellen, PA-C mam:sb D: 01/19/2013 13:46:34 ET T: 01/19/2013 14:23:53 ET JOB#: [id number]  cc: Marcus Ellen, PA-C, <Dictator> Serita Sheller. Maryellen Pile, MD Marcus A [facility] PA ELECTRONICALLY SIGNED 01/20/2013 8:14

## 2015-03-09 NOTE — H&P (Signed)
PATIENT NAME:  Marcus [name], Marcus [name] MR#:  [id number] DATE OF BIRTH:  Jul 26, 1946  DATE OF ADMISSION:  01/19/2013  PRIMARY CARE PHYSICIAN: Nicky Pugh, MD  REFERRING PHYSICIAN:  Charlesetta Ivory, MD  CHIEF COMPLAINT: Not feeling well and feeling short of breath.   HISTORY OF PRESENT ILLNESS: This is a 69 year old male with significant past medical history of CVA, hypertension, coronary artery disease, severe cardiomyopathy with ejection fraction 25% to 30%, with chronic respiratory failure on baseline 2 liters nasal cannula and COPD exacerbation who presents with complaints of shortness of breath and not feeling well. The patient reports he was discharged from the hospital before 2 weeks, and he has not been feeling well since then, uncomfortable over the last few days. He has been feeling more weak, and he reports shortness of breath over the night. The patient is on 2 liters nasal cannula at baseline. Upon presentation to the ED, the patient was saturating 90% on room air, was noticed to be hypoxic, had elevated proBNP, but appears to be lower than his baseline and had negative troponins but EKG did show some new T wave inversion in leads V5 and V6. So hospitalist service was requested to admit the patient for further management and workup. The patient denies any chest pain, denies any cough, any productive sputum, any coffee-ground emesis, denies any palpitations, but complains of some dizziness and lightheadedness. The patient was not orthostatic in the ED.   PAST MEDICAL HISTORY: 1.  History of CVA. 2.  History of hypertension.  3.  History of coronary artery disease.  4.  History of COPD.  5.  Chronic respiratory failure, on 2 liters nasal cannula.  6.  History of severe cardiomegaly with ejection fraction 25% to 30%.   ALLERGIES:  No known drug allergies.   HOME MEDICATIONS: 1.  Aggrenox 1 tablet 2 times a day.  2.  Coreg 25 mg oral 2 times a day.  3.  Lisinopril 5 mg oral daily.  4.  Advair  Diskus 250/50 b.i.d.  5.  Spiriva inhaler 1 time daily.  6.  Lasix 40 mg oral daily.  7.  Levaquin 500 mg oral daily for 5 days.  8.  Potassium 10 milliequivalents extended release daily.   FAMILY HISTORY: Significant for hypertension and CVA in the family. No history for early coronary artery disease.   SOCIAL HISTORY: The patient lives at home. The patient smokes 1 pack per day. Denies any substance abuse. Has remote history of cocaine and marijuana use. Denies any alcohol use.   REVIEW OF SYSTEMS: CONSTITUTIONAL: Denies any fever, chills. Complains of fatigue and weakness.  EYES: Denies blurry vision, double vision, pain, inflammation or glaucoma.  ENT: Denies tinnitus, ear pain, hearing loss, epistaxis or discharge.  RESPIRATORY: Denies any cough, hemoptysis or productive sputum. Complains of shortness of breath.  CARDIOVASCULAR: Denies any chest pain, edema, arrhythmia, palpitations or syncope. Had some dizziness.  GASTROINTESTINAL: Denies nausea, vomiting, diarrhea, abdominal pain, hematemesis, rectal bleed, coffee-ground emesis, hematochezia or melena.  GENITOURINARY: Denies dysuria, hematuria or renal colic.  ENDOCRINE:  Denies polyuria, polydipsia, heat or cold intolerance.  HEMATOLOGY: Denies anemia, easy bruising or bleeding diathesis.  INTEGUMENTARY: Denies acne, rash or skin lesions.  MUSCULOSKELETAL: Denies neck pain, shoulder pain, arthritis, cramps or gout.  NEUROLOGIC: Denies dysarthria, epilepsy, tremors, vertigo, seizures or memory loss. Has history of CVA with very minimal residual left side weakness.  PSYCHIATRIC: Denies anxiety, insomnia, bipolar disorder or depression. Has remote history of substance abuse.  PHYSICAL EXAMINATION: VITAL SIGNS: Temperature 97.6, pulse 81, respiratory rate 16, blood pressure 146/76 and saturating 95% on room air.  GENERAL: Elderly male, looks comfortable in bed, in no apparent distress.  HEENT: Head atraumatic, normocephalic. Pupils  equal and reactive to light. Pink conjunctivae. Anicteric sclerae. Moist oral mucosa.  NECK: Supple. No thyromegaly. No JVD.  CHEST: Good air entry bilaterally. Has mild crackles at the bases and no wheezing.  CARDIOVASCULAR: S1, S2 heard. No rubs, murmurs or gallops.  ABDOMEN: Soft, nontender and nondistended. Bowel sounds present.  EXTREMITIES: No edema. No clubbing. No cyanosis.  PSYCHIATRIC: Appropriate affect. Awake and alert x 3. Intact judgment and insight.  NEUROLOGIC: Cranial nerves grossly intact. Motor 5 out of 5.  SKIN: Normal skin turgor. Warm and dry. No rash.   PERTINENT LABS: Glucose 154. BNP 3919. BUN 19, creatinine 1.2, sodium 142, potassium 3.8, chloride 108, CO2 30, ALT 30, AST 18, alk phos 83. Troponin 0.05. White blood cell 15.5, hemoglobin 15.5, hematocrit 47.8, platelets 183.  DIAGNOSTICS:  EKG showing new T wave inversion in V5 and V6.   ASSESSMENT AND PLAN: 1.  Dyspnea with T-wave inversion. The patient's saturation appears to be at baseline, but given his EKG changes will be admitted to telemetry unit. Will be kept on telemetry monitor. Will cycle his troponins and follow the trend. As well will consult cardiology service especially given his history of coronary artery disease and hypertension and severe cardiomyopathy.  2.  Chronic respiratory failure. Appears to be at baseline. On home oxygen 2 liters nasal cannula secondary to his chronic obstructive pulmonary disease and heart failure.  3.  Severe cardiomegaly with ejection fraction 25% to 30%. The patient is already on lisinopril and beta blocker. Will add Aldactone 25 mg oral daily.  4.  History of chronic obstructive pulmonary disease. Does not appear to be in exacerbation. We will continue with his home medication. We will add albuterol as needed. 5.  History of cerebrovascular accident. Continue with Aggrenox.  6.  Hypertension, acceptable. We will continue home meds.  7.  Deep vein thrombosis prophylaxis.  Subcutaneous heparin.  8.  Gastrointestinal prophylaxis. On proton pump inhibitors.   9.  Tobacco abuse. The patient was counseled and will be started on NicoDerm patch.        CODE STATUS: The patient is FULL CODE.   TOTAL TIME SPENT ON ADMISSION AND PATIENT CARE: 55 minutes.  ____________________________ Albertine Patricia, MD dse:sb D: 01/19/2013 07:52:46 ET T: 01/19/2013 08:09:06 ET JOB#: [id number]  cc: Albertine Patricia, MD, <Dictator> DAWOOD Graciela Husbands MD ELECTRONICALLY SIGNED 01/27/2013 2:49

## 2015-03-09 NOTE — Discharge Summary (Signed)
PATIENT NAME:  Marcus [name], Marcus [name] MR#:  [id number] DATE OF BIRTH:  1945/12/23  DATE OF ADMISSION:  01/02/2013 DATE OF DISCHARGE:  01/04/2013  For [name] detailed note, please take [name] look at the history and physical done on admission by Dr. Randol KernElgergawy.   DIAGNOSES AT DISCHARGE: 1.  Acute respiratory failure, likely secondary to [name] combination of chronic obstructive pulmonary disease exacerbation and systolic congestive heart failure.  2.  Acute congestive heart failure exacerbation, likely systolic dysfunction.  3.  Chronic obstructive pulmonary disease exacerbation due to ongoing tobacco abuse.  4.  History of previous cerebrovascular accident.  5.  History of hypertension.  6.  History of severe cardiomyopathy, ejection fraction of 25% to 30%.   DIET:  The patient is being discharged on [name] low-sodium diet.   ACTIVITY:  As tolerated.   FOLLOW-UP:  With Dr. Maryellen PileEason in the next 1 to 2 weeks.   DISCHARGE MEDICATIONS:  Are as follows:  Lisinopril 5 mg daily, Coreg 25 mg twice daily, Aggrenox one cap twice daily, Advair 250/50 1 puff twice daily, Spiriva 1 puff daily, prednisone taper starting at 60 mg down to 10 mg over the next 6 days, Levaquin 500 mg daily x 5 days, Lasix 40 mg daily and potassium 10 mEq daily.   PERTINENT STUDIES DONE DURING THE HOSPITAL COURSE:  Are as follows:  [name] chest x-ray done on admission showing interstitial infiltrate, likely representing pulmonary edema.  [name] 2-dimensional echocardiogram showing three chamber dilatation with severe LV dysfunction with EF of 28%, mild diastolic dysfunction with mild mitral regurgitation.   BRIEF HOSPITAL COURSE:  This is [name] 69 year old male with medical problems as mentioned above, presented to the hospital on February 16th secondary to shortness of breath, wheezing.  1.  Acute respiratory failure.  The most likely cause of patient's respiratory failure was [name] combination of COPD and CHF.  The patient presented with x-ray findings consistent with  pulmonary edema and elevated BNP.  He was treated with IV Lasix for CHF and started on IV steroids and empiric antibiotics and nebulizer treatments for his COPD.  The patient's clinical symptoms since admission have significantly improved.  He was ambulated on room air, did desaturate to about 87%, therefore was discharged on home oxygen.  He was also discharged on Lasix for his CHF along with some steroid taper and Levaquin as stated for his COPD.  2.  COPD exacerbation, likely secondary to ongoing tobacco abuse, complicated with underlying acute bronchitis.  Again, as mentioned, the patient was treated with IV steroids, around-the-clock nebulizer treatments, he was not on any inhalers prior to coming in, therefore I did initiate Advair and Spiriva.  He was also assessed for home oxygen which he qualified for and which was arranged to him prior to discharge.  The patient also was discharged on [name] prednisone taper as stated.  3.  CHF.  This was acute on chronic systolic congestive heart failure.  The patient was not on any diuretics prior to coming in.  His echocardiogram showed severe LV dysfunction with EF of about 25%.  The patient was on Coreg prior to coming in.  I did start him on [name] low dose Ace inhibitor.  He has diuresed well with IV diuretics and I discharged him on some by mouth Lasix with potassium supplements.  He will continue to follow up with his primary care physician.  I also arranged home health nursing services regarding his CHF.  4.  Cardiomyopathy, ejection fraction of 25%.  The patient was discharged on [name] beta-blocker, ACE inhibitor and diuretics as mentioned.  5.  History of previous CVA.  The patient will continue his Aggrenox as stated.   CODE STATUS:  THE PATIENT IS [name] FULL CODE.   DISPOSITION:  He is being discharged with home health nursing and physical therapy services.   TIME SPENT:  Is 40 minutes.     ____________________________ Rolly Pancake. Cherlynn Kaiser,  MD vjs:ea D: 01/04/2013 16:29:01 ET T: 01/04/2013 23:43:01 ET JOB#: [id number]  cc: Rolly Pancake. Cherlynn Kaiser, MD, <Dictator> Dr. Delilah Shan MD ELECTRONICALLY SIGNED 01/14/2013 1:01

## 2015-03-09 NOTE — Discharge Summary (Signed)
PATIENT NAME:  Marcus [name], Marcus [name] MR#:  [id number] DATE OF BIRTH:  1946/03/18  DATE OF ADMISSION:  06/09/2013 DATE OF DISCHARGE:  06/10/2013  ADMITTING DIAGNOSES: Dizziness, confusion.   DISCHARGE DIAGNOSES: 1.  Dizziness, which is [name] chronic problem. The patient was given some meclizine with improvement in his symptoms.  2.  Confusion, likely related to the patient having underlying dementia which is likely [name] chronic issue as well. He will be seen by Neurology as an outpatient for further re-evaluation.  3.  History of chronic obstructive pulmonary disease with need to wear O2 at all times.  4.  Chronic systolic congestive heart failure.  5.  Nicotine addiction. The patient counseled regarding smoking cessation.  6.  Hypertension.    CONSULTANTS: Case management.   PERTINENT LABS AND EVALUATIONS: EKG showed normal sinus rhythm with possible left atrial enlargement. BNP was 1192. Urinalysis was negative for nitrates and leukocytes. Glucose 159, BUN 17, creatinine 1.15, sodium 142, potassium 4.0, chloride 110, CO2 is 27, calcium 9.1, bilirubin total 1.1, albumin was 3.2. WBC 9.8, hemoglobin 16, platelet count 195. CT scan of the head showed no acute intracranial processes. Chest x-ray PA and lateral shows findings which may reflect low-grade CHF. TSH 0.79, ammonia was just slightly abnormal at 33, which is of unclear significance. CT scan of the head showed no acute intracranial processes.   HOSPITAL COURSE: Please refer to H and P done by me on admission. The patient is [name] 69 year old who has [name] history of previous CVA, hypertension, coronary artery disease, cardiomyopathy with EF of 25% to 30% with history of chronic respiratory failure who is  supposed to be on 2 liters of oxygen but he was not wearing this all the time, who presented with lightheadedness, dizziness as well as according to his niece, he may have been confused [name] little.  In reviewing his record, he has [name] history of having dizziness in the  past. There was no specific etiology found for his dizziness. The patient was treated with some meclizine with improvement in his symptom. In terms of his confusion, the patient was alert, oriented to time, place, person. There seems to be some issues with short term memory and I think he may have dementia. At this time arrangements for outpatient Neurology evaluation will be done. At this time, he is doing much better and is stable for discharge.   DISCHARGE MEDICATIONS: Carvedilol 25, 1 tab p.o. b.i.d., lisinopril 20 daily, Lasix 20 daily, Advair 250/50 one puff b.i.d., spironolactone 25 p.o. daily, Spiriva 18 mcg daily, acetaminophen 2 tabs q. 4 p.r.n., meclizine 12.5 mg 1 tab p.o. q. 8 p.r.n. dizzy, oxygen 2 liters nasal cannula at all times.   DIET: 4 grams sodium.   ACTIVITY: As tolerated.   FOLLOW-UP: With primary M.D. in 1 to 2 weeks. [facility] 2 to 4 weeks.   NOTE:  35 minutes spent on the discharge.    ____________________________ Lacie ScottsShreyang H. Allena KatzPatel, MD shp:dp D: 06/10/2013 14:33:30 ET T: 06/10/2013 17:22:23 ET JOB#: [id number]  cc: Miesha Bachmann H. Allena KatzPatel, MD, <Dictator> Charise CarwinSHREYANG H Amirra Herling MD ELECTRONICALLY SIGNED 06/20/2013 8:56

## 2015-03-09 NOTE — Discharge Summary (Signed)
PATIENT NAME:  Marcus Marcus [name], Marcus Marcus [name] MR#:  [id number] DATE OF BIRTH:  Feb 22, 1946  DATE OF ADMISSION:  01/19/2013 DATE OF DISCHARGE:  01/20/2013  PRIMARY CARE PHYSICIAN:  Alden ServerErnest B. Maryellen PileEason, MD  DISCHARGE DIAGNOSES:  1.  Chest pain secondary to gastroesophageal reflux disease.  2.  Chronic systolic congestive heart failure.  3.  Tobacco abuse.  4.  Noncompliance.   CONSULTANTS: Dr. Welton FlakesKhan of cardiology.   IMAGING STUDIES: Include Marcus [name] 2-D echocardiogram which showed ejection fraction between 25% to 30%, without any focal wall motion abnormalities. Does have global hypokinesis which is unchanged from prior echo.   ADMITTING HISTORY AND PHYSICAL AND HOSPITAL COURSE: Please see detailed H and P dictated by Dr. Randol KernElgergawy on 01/19/2013. In brief, this is Marcus [name] 69 year old male patient with history of systolic CHF, ischemic cardiomyopathy, COPD, CVA, who presented to the hospital complaining of weakness and shortness of breath. The patient was found to have some new T wave inversions so was admitted to the hospitalist service for further workup and treatment.   HOSPITAL COURSE: Ischemic cardiomyopathy. The patient had T wave inversions, likely secondary to ischemic cardiomyopathy. Had an echocardiogram which was unchanged with the wall motion. Dr. Welton FlakesKhan of cardiology saw the patient with his PA, [facility], and suggested that patient get an outpatient stress test.   On the day of discharge, the patient is being continued on his aspirin, Plavix, beta blocker, ACE inhibitor statin, and is being discharged home to follow up with Dr. Welton FlakesKhan of cardiology for stress chest. His blood pressure on day of discharge was 128/80, saturating 94% on room air. The patient was awake, alert, and is being discharged home in Marcus [name] fair condition.   DISCHARGE MEDICATIONS: Include: 1.  Lisinopril 5 mg oral once Marcus [name] day.  2.  Coreg 25 mg oral 2 times Marcus [name] day.  3.  Aggrenox 1 capsule oral 2 times Marcus [name] day.  4.  Fluticasone/salmeterol 250/50, 1 puff  inhaled twice Marcus [name] day.  5.  Spiriva 18 mcg inhaled once Marcus [name] day.  6.  Lasix 40 mg oral once Marcus [name] day.  7.  Potassium chloride 10 mEq oral once Marcus [name] day.   DISCHARGE INSTRUCTIONS: The patient was discharged home on Marcus [name] low sodium, regular consistency diet. Activity as tolerated. Follow up with [facility], Dr. Welton FlakesKhan, on 01/27/2013 at 1:30 p.m. The patient on the day of discharge was also counseled for greater than 3 minutes to quit smoking, which could have been the possible cause for readmission.   TIME SPENT: Discharge day time spent was 40 minutes.   ____________________________ Molinda BailiffSrikar R. Sudini, MD srs:jm D: 01/21/2013 13:55:29 ET T: 01/21/2013 20:42:20 ET JOB#: [id number]  cc: Wardell HeathSrikar R. Sudini, MD, <Dictator> Serita ShellerErnest B. Maryellen PileEason, MD Laurier NancyShaukat Marcus [name]. Khan, MD Orie FishermanSRIKAR R SUDINI MD ELECTRONICALLY SIGNED 01/25/2013 20:31

## 2015-03-09 NOTE — Consult Note (Signed)
Brief Consult Note: Diagnosis: New EKG changes.   Patient was seen by consultant.   Consult note dictated.   Orders entered.   Comments: Patient with increased SOB and weakness, EKG with new T wave inversion laterally, but TNI neg x 2 thus far. He has h/o CAD and CHF with cardiomyopathy, elevated BNP. Will get echo, agree with BB. ACE-I, Aldactone. Aggrenox, heparin. PT denies CP at this time. Will get echo to check wall motion and will need nuclear stress test. (since stable can get as outpatient) Will follow with you.  Electronic Signatures: Radene KneeKhan, Shaukat Ali (MD)   (Signed 06-Mar-14 09:07)  Co-Signer: Brief Consult Note Maia PlanManzi, Monica A (PA-C)   (Signed 05-Mar-14 12:56)  Authored: Brief Consult Note  Last Updated: 06-Mar-14 09:07 by Radene KneeKhan, Shaukat Ali (MD)
